# Patient Record
Sex: Male | Born: 1985
Health system: Southern US, Community
[De-identification: ages and names within clinical notes are randomized; demographics above are authoritative.]

## PROBLEM LIST (undated history)

## (undated) DIAGNOSIS — M62830 Muscle spasm of back: Secondary | ICD-10-CM

## (undated) DIAGNOSIS — M2550 Pain in unspecified joint: Secondary | ICD-10-CM

## (undated) DIAGNOSIS — Z973 Presence of spectacles and contact lenses: Secondary | ICD-10-CM

## (undated) HISTORY — PX: COLON SURGERY: SHX602

## (undated) HISTORY — DX: Presence of spectacles and contact lenses: Z97.3

## (undated) HISTORY — PX: FINGER SURGERY: SHX640

## (undated) HISTORY — DX: Pain in unspecified joint: M25.50

## (undated) HISTORY — DX: Muscle spasm of back: M62.830

---
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
## 2014-11-07 ENCOUNTER — Encounter: Payer: Self-pay | Admitting: Medical

## 2014-11-26 ENCOUNTER — Encounter: Payer: Self-pay | Admitting: Medical

## 2014-11-26 ENCOUNTER — Ambulatory Visit (INDEPENDENT_AMBULATORY_CARE_PROVIDER_SITE_OTHER): Payer: BLUE CROSS/BLUE SHIELD | Admitting: Medical

## 2014-11-26 VITALS — BP 100/70 | HR 62 | Temp 98.2°F | Resp 16 | Ht 76.0 in | Wt 224.0 lb

## 2014-11-26 DIAGNOSIS — Z0000 Encounter for general adult medical examination without abnormal findings: Secondary | ICD-10-CM | POA: Diagnosis not present

## 2014-11-26 DIAGNOSIS — G245 Blepharospasm: Secondary | ICD-10-CM

## 2014-11-26 DIAGNOSIS — M791 Myalgia, unspecified site: Secondary | ICD-10-CM

## 2014-11-26 DIAGNOSIS — Z113 Encounter for screening for infections with a predominantly sexual mode of transmission: Secondary | ICD-10-CM | POA: Diagnosis not present

## 2014-11-26 DIAGNOSIS — M2550 Pain in unspecified joint: Secondary | ICD-10-CM

## 2014-11-26 LAB — COMPREHENSIVE METABOLIC PANEL
ALT: 21 U/L (ref 0–53)
AST: 24 U/L (ref 0–37)
Albumin: 4.3 g/dL (ref 3.5–5.2)
Alkaline Phosphatase: 54 U/L (ref 39–117)
BUN: 12 mg/dL (ref 6–23)
CO2: 27 mEq/L (ref 19–32)
Calcium: 9.6 mg/dL (ref 8.4–10.5)
Chloride: 102 mEq/L (ref 96–112)
Creat: 1.04 mg/dL (ref 0.50–1.35)
Glucose, Bld: 83 mg/dL (ref 70–99)
Potassium: 4.4 mEq/L (ref 3.5–5.3)
Sodium: 140 mEq/L (ref 135–145)
Total Bilirubin: 0.5 mg/dL (ref 0.2–1.2)
Total Protein: 6.7 g/dL (ref 6.0–8.3)

## 2014-11-26 LAB — POCT URINALYSIS DIPSTICK
Bilirubin, UA: NEGATIVE
Blood, UA: NEGATIVE
Glucose, UA: NEGATIVE
Ketones, UA: NEGATIVE
Leukocytes, UA: NEGATIVE
Nitrite, UA: NEGATIVE
Protein, UA: NEGATIVE
Spec Grav, UA: 1.015
Urobilinogen, UA: NEGATIVE
pH, UA: 6

## 2014-11-26 LAB — CBC
HCT: 46.1 % (ref 39.0–52.0)
Hemoglobin: 15.6 g/dL (ref 13.0–17.0)
MCH: 29.8 pg (ref 26.0–34.0)
MCHC: 33.8 g/dL (ref 30.0–36.0)
MCV: 88 fL (ref 78.0–100.0)
MPV: 8.6 fL (ref 8.6–12.4)
Platelets: 307 10*3/uL (ref 150–400)
RBC: 5.24 MIL/uL (ref 4.22–5.81)
RDW: 13.6 % (ref 11.5–15.5)
WBC: 4.1 10*3/uL (ref 4.0–10.5)

## 2014-11-26 LAB — LIPID PANEL
Cholesterol: 133 mg/dL (ref 0–200)
HDL: 52 mg/dL (ref >=40)
LDL Cholesterol: 69 mg/dL (ref 0–99)
Total CHOL/HDL Ratio: 2.6 Ratio
Triglycerides: 62 mg/dL (ref <150)
VLDL: 12 mg/dL (ref 0–40)

## 2014-11-26 LAB — CK: Total CK: 563 U/L — ABNORMAL HIGH (ref 7–232)

## 2014-11-26 LAB — TSH: TSH: 1.193 u[IU]/mL (ref 0.350–4.500)
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
## 2014-11-27 LAB — RPR

## 2014-11-27 LAB — GC/CHLAMYDIA PROBE AMP
CT Probe RNA: NEGATIVE
GC Probe RNA: NEGATIVE

## 2014-11-27 LAB — SEDIMENTATION RATE: Sed Rate: 1 mm/hr (ref 0–15)

## 2014-11-27 LAB — HIV ANTIBODY (ROUTINE TESTING W REFLEX): HIV 1&2 Ab, 4th Generation: NONREACTIVE

## 2015-01-23 ENCOUNTER — Ambulatory Visit (INDEPENDENT_AMBULATORY_CARE_PROVIDER_SITE_OTHER): Payer: BLUE CROSS/BLUE SHIELD | Admitting: Family Medicine

## 2015-01-23 VITALS — BP 115/76 | HR 87 | Temp 99.1°F | Resp 20 | Ht 75.0 in | Wt 217.2 lb

## 2015-01-23 DIAGNOSIS — L089 Local infection of the skin and subcutaneous tissue, unspecified: Secondary | ICD-10-CM

## 2015-01-23 DIAGNOSIS — R21 Rash and other nonspecific skin eruption: Secondary | ICD-10-CM | POA: Diagnosis not present

## 2015-01-23 DIAGNOSIS — R590 Localized enlarged lymph nodes: Secondary | ICD-10-CM

## 2015-01-23 DIAGNOSIS — L0390 Cellulitis, unspecified: Secondary | ICD-10-CM | POA: Diagnosis not present

## 2015-01-23 DIAGNOSIS — L0291 Cutaneous abscess, unspecified: Secondary | ICD-10-CM

## 2015-01-23 LAB — POCT CBC
Granulocyte percent: 47.9 %G (ref 37–80)
HCT, POC: 49.8 % (ref 43.5–53.7)
Hemoglobin: 16.2 g/dL (ref 14.1–18.1)
Lymph, poc: 2.1 (ref 0.6–3.4)
MCH, POC: 29.4 pg (ref 27–31.2)
MCHC: 32.6 g/dL (ref 31.8–35.4)
MCV: 90.1 fL (ref 80–97)
MID (cbc): 0.6 (ref 0–0.9)
MPV: 6.1 fL (ref 0–99.8)
POC Granulocyte: 2.4 (ref 2–6.9)
POC LYMPH PERCENT: 41.1 %L (ref 10–50)
POC MID %: 11 %M (ref 0–12)
Platelet Count, POC: 305 10*3/uL (ref 142–424)
RBC: 5.52 M/uL (ref 4.69–6.13)
RDW, POC: 14 %
WBC: 5 10*3/uL (ref 4.6–10.2)

## 2015-01-23 LAB — POCT GLYCOSYLATED HEMOGLOBIN (HGB A1C): Hemoglobin A1C: 5

## 2015-01-23 MED ORDER — CEPHALEXIN 500 MG PO CAPS: 500.0000 mg | ORAL_CAPSULE | Freq: Four times a day (QID) | ORAL | Status: DC

## 2015-01-23 MED ORDER — CEFTRIAXONE SODIUM 1 G IJ SOLR
1.0000 g | Freq: Once | INTRAMUSCULAR | Status: AC
  Administered 2015-01-23: 1 g via INTRAMUSCULAR

## 2015-01-23 NOTE — Progress Notes (Signed)
Chief Complaint:  Chief Complaint  Patient presents with  . Rash    Left Shoulder/Back/Neck  . Arm Pain    Left Arm-Numbness  . Knot on Neck    HPI: Mark Freeman is a 29 y.o. male who reports to [facility] today complaining of  10 day history of rash on his right arm and then started having rash on his left shoulder, upper back and neck. He started having swelling of his neck glands bailterally yesterday, it hurts. No fevers or chills. He denies any immunocompromise. No one in the house has it. It itches. There was some burning and pain, He did have some numbness and tingling on his left arm when it first started but no longer. He has been scratching it, there is no drainage. No hx of MRSA. Works out at home so is not in contact with anyone or thing with skin issues.   Past Medical History  Diagnosis Date  . Wears contact lenses   . Joint pain     right ankle, bilat knees, right hip  . Muscle spasm of back     occasionally   Past Surgical History  Procedure Laterality Date  . Finger surgery      right 4th s/p dislocation  . 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 Marital Status: Single    Spouse Name: N/A  . Number of Children: N/A  . Years of Education: N/A   Social History Main Topics  . Smoking status: Current Some Day Smoker    Types: Cigars  . Smokeless tobacco: Never Used     Comment: CIGARS  . Alcohol Use: 3.6 oz/week    2 Glasses of wine, 2 Cans of beer, 2 Shots of liquor, 0 Standard drinks or equivalent per week  . Drug Use: No  . Sexual Activity: Not on file   Other Topics Concern  . 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   Single, no children.   Exercise - weights, biking, running.     Family History  Problem Relation Age of Onset  . Cancer Father 42    prostate  . Cancer Maternal  Grandfather   . Urolithiasis Maternal Grandfather   . Stroke Paternal Grandmother   . Cancer Paternal Grandmother     breast  . Heart disease Neg Hx   . Diabetes Neg Hx   . Hypertension Neg Hx    No Known Allergies Prior to Admission medications   Medication Sig Start Date End Date Taking? Authorizing Provider  diphenhydrAMINE (BENADRYL) 25 MG tablet Take 2,550 mg by mouth at bedtime as needed.   Yes Historical Provider, MD     ROS: The patient denies fevers, chills, night sweats, unintentional weight loss, chest pain, palpitations, wheezing, dyspnea on exertion, nausea, vomiting, abdominal pain, dysuria, hematuria, melena  All other systems have been reviewed and were otherwise negative with the exception of those mentioned in the HPI and as above.    PHYSICAL EXAM: Filed Vitals:   01/23/15 2037  BP: 115/76  Pulse: 87  Temp: 99.1 F (37.3 C)  Resp: 20   Body mass index is 27.15 kg/(m^2).   General: Alert, no acute distress HEENT:  Normocephalic, atraumatic, oropharynx patent. EOMI, PERRLA, tm normal, fundo exam nl Cardiovascular:  Regular rate and  rhythm, no rubs murmurs or gallops.  No Carotid bruits, radial pulse intact.  Respiratory: Clear to auscultation bilaterally.  No wheezes, rales, or rhonchi.  No cyanosis, no use of accessory musculature Abdominal: No organomegaly, abdomen is soft and non-tender, positive bowel sounds. No masses. Skin: + macular pap diffuse erythematous excoriated rash on  Left shoulder , left upper back and folliculitis and abscess like lesions on nape of neck and in hairline. Tender area of neck with minimal fluctuant , slightly hard.  Neurologic: Facial musculature symmetric. Psychiatric: Patient acts appropriately throughout our interaction. Lymphatic: No axillary or submandibular lymphadenopathy. HE has bialteral Cervical LAD, left more prominent than right Musculoskeletal: Gait intact. No edema, tenderness   LABS: Results for orders  placed or performed in visit on 01/23/15  POCT CBC  Result Value Ref Range   WBC 5.0 4.6 - 10.2 K/uL   Lymph, poc 2.1 0.6 - 3.4   POC LYMPH PERCENT 41.1 10 - 50 %L   MID (cbc) 0.6 0 - 0.9   POC MID % 11.0 0 - 12 %M   POC Granulocyte 2.4 2 - 6.9   Granulocyte percent 47.9 37 - 80 %G   RBC 5.52 4.69 - 6.13 M/uL   Hemoglobin 16.2 14.1 - 18.1 g/dL   HCT, POC 96.0 45.4 - 53.7 %   MCV 90.1 80 - 97 fL   MCH, POC 29.4 27 - 31.2 pg   MCHC 32.6 31.8 - 35.4 g/dL   RDW, POC 09.8 %   Platelet Count, POC 305 142 - 424 K/uL   MPV 6.1 0 - 99.8 fL  POCT glycosylated hemoglobin (Hb A1C)  Result Value Ref Range   Hemoglobin A1C 5.0      EKG/XRAY:   Primary read interpreted by Dr. Conley Rolls at [facility].   ASSESSMENT/PLAN: Encounter Diagnoses  Name Primary?  . Lymphadenopathy, cervical   . Rash and nonspecific skin eruption   . Skin infection Yes  . Cellulitis and abscess    Rocephin 1 gram given Labs for HIV pending Rx Keflex, warm compresses If no improvement and pain worsen and rash does not go more across midline in neck area then consider Shingles Since he has abscess , folliculitis and there is some crossing of the midline I am gogint o treat it as flliculitis/cellulitis/abscess Fu prn   Gross sideeffects, risk and benefits, and alternatives of medications d/w patient. Patient is aware that all medications have potential sideeffects and we are unable to predict every sideeffect or drug-drug interaction that may occur.  Jaeven Wanzer DO  01/24/2015 8:14 AM

## 2015-01-23 NOTE — Patient Instructions (Addendum)
Abscess An abscess is an infected area that contains a collection of pus and debris.It can occur in almost any part of the body. An abscess is also known as a furuncle or boil. CAUSES  An abscess occurs when tissue gets infected. This can occur from blockage of oil or sweat glands, infection of hair follicles, or a minor injury to the skin. As the body tries to fight the infection, pus collects in the area and creates pressure under the skin. This pressure causes pain. People with weakened immune systems have difficulty fighting infections and get certain abscesses more often.  SYMPTOMS Usually an abscess develops on the skin and becomes a painful mass that is red, warm, and tender. If the abscess forms under the skin, you may feel a moveable soft area under the skin. Some abscesses break open (rupture) on their own, but most will continue to get worse without care. The infection can spread deeper into the body and eventually into the bloodstream, causing you to feel ill.  DIAGNOSIS  Your caregiver will take your medical history and perform a physical exam. A sample of fluid may also be taken from the abscess to determine what is causing your infection. TREATMENT  Your caregiver may prescribe antibiotic medicines to fight the infection. However, taking antibiotics alone usually does not cure an abscess. Your caregiver may need to make a small cut (incision) in the abscess to drain the pus. In some cases, gauze is packed into the abscess to reduce pain and to continue draining the area. HOME CARE INSTRUCTIONS   Only take over-the-counter or prescription medicines for pain, discomfort, or fever as directed by your caregiver.  If you were prescribed antibiotics, take them as directed. Finish them even if you start to feel better.  If gauze is used, follow your caregiver's directions for changing the gauze.  To avoid spreading the infection:  Keep your draining abscess covered with a  bandage.  Wash your hands well.  Do not share personal care items, towels, or whirlpools with others.  Avoid skin contact with others.  Keep your skin and clothes clean around the abscess.  Keep all follow-up appointments as directed by your caregiver. SEEK MEDICAL CARE IF:   You have increased pain, swelling, redness, fluid drainage, or bleeding.  You have muscle aches, chills, or a general ill feeling.  You have a fever. MAKE SURE YOU:   Understand these instructions.  Will watch your condition.  Will get help right away if you are not doing well or get worse. Document Released: 03/18/2005 Document Revised: 12/08/2011 Document Reviewed: 08/21/2011 [facility] Patient Information 2015 [place], [place]. This information is not intended to replace advice given to you by your health care provider. Make sure you discuss any questions you have with your health care provider.   Cephalexin tablets or capsules What is this medicine? CEPHALEXIN (sef a LEX in) is a cephalosporin antibiotic. It is used to treat certain kinds of bacterial infections It will not work for colds, flu, or other viral infections. This medicine may be used for other purposes; ask your health care provider or pharmacist if you have questions. COMMON BRAND NAME(S): Biocef, Keflex, Keftab What should I tell my health care provider before I take this medicine? They need to know if you have any of these conditions: -kidney disease -stomach or intestine problems, especially colitis -an unusual or allergic reaction to cephalexin, other cephalosporins, penicillins, other antibiotics, medicines, foods, dyes or preservatives -pregnant or trying to get pregnant -  breast-feeding How should I use this medicine? Take this medicine by mouth with a full glass of water. Follow the directions on the prescription label. This medicine can be taken with or without food. Take your medicine at regular intervals. Do not take your  medicine more often than directed. Take all of your medicine as directed even if you think you are better. Do not skip doses or stop your medicine early. Talk to your pediatrician regarding the use of this medicine in children. While this drug may be prescribed for selected conditions, precautions do apply. Overdosage: If you think you have taken too much of this medicine contact a poison control center or emergency room at once. NOTE: This medicine is only for you. Do not share this medicine with others. What if I miss a dose? If you miss a dose, take it as soon as you can. If it is almost time for your next dose, take only that dose. Do not take double or extra doses. There should be at least 4 to 6 hours between doses. What may interact with this medicine? -probenecid -some other antibiotics This list may not describe all possible interactions. Give your health care provider a list of all the medicines, herbs, non-prescription drugs, or dietary supplements you use. Also tell them if you smoke, drink alcohol, or use illegal drugs. Some items may interact with your medicine. What should I watch for while using this medicine? Tell your doctor or health care professional if your symptoms do not begin to improve in a few days. Do not treat diarrhea with over the counter products. Contact your doctor if you have diarrhea that lasts more than 2 days or if it is severe and watery. If you have diabetes, you may get a false-positive result for sugar in your urine. Check with your doctor or health care professional. What side effects may I notice from receiving this medicine? Side effects that you should report to your doctor or health care professional as soon as possible: -allergic reactions like skin rash, itching or hives, swelling of the face, lips, or tongue -breathing problems -pain or trouble passing urine -redness, blistering, peeling or loosening of the skin, including inside the mouth -severe  or watery diarrhea -unusually weak or tired -yellowing of the eyes, skin Side effects that usually do not require medical attention (report to your doctor or health care professional if they continue or are bothersome): -gas or heartburn -genital or anal irritation -headache -joint or muscle pain -nausea, vomiting This list may not describe all possible side effects. Call your doctor for medical advice about side effects. You may report side effects to FDA at 1-800-FDA-1088. Where should I keep my medicine? Keep out of the reach of children. Store at room temperature between 59 and 86 degrees F (15 and 30 degrees C). Throw away any unused medicine after the expiration date. NOTE: This sheet is a summary. It may not cover all possible information. If you have questions about this medicine, talk to your doctor, pharmacist, or health care provider.  2015, Elsevier/Gold Standard. (2007-09-12 17:09:13)

## 2015-01-24 ENCOUNTER — Telehealth: Payer: Self-pay | Admitting: Family Medicine

## 2015-01-24 LAB — HIV ANTIBODY (ROUTINE TESTING W REFLEX): HIV 1&2 Ab, 4th Generation: NONREACTIVE

## 2015-01-24 NOTE — Telephone Encounter (Signed)
Spoke to patient about labs, rash drying up, not worse. NOt greatly iproved,just got keflex

## 2015-01-25 ENCOUNTER — Encounter: Payer: Self-pay | Admitting: Family Medicine

## 2015-01-25 ENCOUNTER — Ambulatory Visit (INDEPENDENT_AMBULATORY_CARE_PROVIDER_SITE_OTHER): Payer: BLUE CROSS/BLUE SHIELD | Admitting: Family Medicine

## 2015-01-25 ENCOUNTER — Telehealth: Payer: Self-pay

## 2015-01-25 VITALS — BP 116/80 | HR 96 | Wt 221.0 lb

## 2015-01-25 DIAGNOSIS — B029 Zoster without complications: Secondary | ICD-10-CM | POA: Diagnosis not present

## 2015-01-25 MED ORDER — VALACYCLOVIR HCL 1 G PO TABS: 1000.0000 mg | ORAL_TABLET | Freq: Three times a day (TID) | ORAL | Status: DC

## 2015-01-25 NOTE — Progress Notes (Signed)
   Subjective:    Patient ID: Mark Freeman, male    DOB: Apr 26, 1986, 29 y.o.   MRN: [id number]  HPI He is here for evaluation of rash. He states that it started on Monday at the base of his neck and on the left shoulder. He was seen in urgent care and placed on an anti-biotic. He is here for follow-up. He states that he is noted a spread of this and does have burning and tingling sensation down the left arm.   Review of Systems     Objective:   Physical Exam Erythematous raised early vesicles are noted in the C5 distribution on the neck and shoulder.       Assessment & Plan:  Shingles - Plan: valACYclovir (VALTREX) 1000 MG tablet  he has classic C5 nerve root distribution. I will place him on Valtrex. Explained the diagnosis and to avoid being around pregnant women and small children. I will give him Valtrex but I explained that might not help. He is to use ibuprofen 800 3 times a day for the pain. If he needs a stronger pain medication he will call me.

## 2015-01-25 NOTE — Patient Instructions (Signed)

## 2015-01-25 NOTE — Telephone Encounter (Signed)
Julie-I don't know what your normal policy is but when someone does this I just write it off as a no charge, shingles was in the differential. I called the patient 12 hours after his OV  and he said the rash was drying up. I was planning to give it another 12 hours to see how the antibiotics would help once it was in his system for 48 hours and change to valtrex if no improvement. I will call mom to say that we will ask billing to do a no charge. Mom was notified of no charge notice. Thanks

## 2015-01-25 NOTE — Telephone Encounter (Signed)
  The following email was submitted to your website from [place], [facility] My son came into your office on Wednesday, August 3 for a very bad rash and swollen lymph nodes on the side of his neck.  The doctor diagnosed him with a skin rash and an abscess on his hairline.  He received a antibodic shot and prescription for antibodics.  As of this morning, the lymph nodes pain had increased.  He was able to go to his regular doctor (John C. Lalonde)and was diagnosed with shingles. He should [place] a shingles pattern and it was the exact pattern my son has along with the pain that went down his arm.  He was given prescription for Valtrex, was told to pain advil and put cold compressor when it starts itching&gt; Dr. Susann Givens told him to stop taking the antibodics that was prescribed by your office.  We are very grateful the Urgent was able to see him and the doctor/staff there was very nice and concerned, but unfortunately, misdiagnosed him.  I'm his mother, I was with him on both appointments.  I'm writing to request that he should not be billed for this visit given the outcome.  I've provided my email address and my cell is [phone].  Thank you so much.  Date of birth: [birth year]

## 2015-01-28 NOTE — Telephone Encounter (Signed)
Forwarded Dr. Irwin Brakeman instructions to billing staff.

## 2015-11-25 ENCOUNTER — Encounter (HOSPITAL_COMMUNITY): Payer: Self-pay

## 2015-11-25 ENCOUNTER — Emergency Department (HOSPITAL_COMMUNITY)
Admission: EM | Admit: 2015-11-25 | Discharge: 2015-11-25 | Disposition: A | Discharge status: Home / Self Care | Payer: BLUE CROSS/BLUE SHIELD | Attending: Emergency Medicine | Admitting: Emergency Medicine

## 2015-11-25 ENCOUNTER — Emergency Department (HOSPITAL_COMMUNITY): Payer: BLUE CROSS/BLUE SHIELD

## 2015-11-25 DIAGNOSIS — Y92410 Unspecified street and highway as the place of occurrence of the external cause: Secondary | ICD-10-CM | POA: Diagnosis not present

## 2015-11-25 DIAGNOSIS — F17210 Nicotine dependence, cigarettes, uncomplicated: Secondary | ICD-10-CM | POA: Diagnosis not present

## 2015-11-25 DIAGNOSIS — S53402A Unspecified sprain of left elbow, initial encounter: Secondary | ICD-10-CM | POA: Diagnosis not present

## 2015-11-25 DIAGNOSIS — Z79899 Other long term (current) drug therapy: Secondary | ICD-10-CM | POA: Diagnosis not present

## 2015-11-25 DIAGNOSIS — Y998 Other external cause status: Secondary | ICD-10-CM | POA: Diagnosis not present

## 2015-11-25 DIAGNOSIS — Y9389 Activity, other specified: Secondary | ICD-10-CM | POA: Insufficient documentation

## 2015-11-25 DIAGNOSIS — Z792 Long term (current) use of antibiotics: Secondary | ICD-10-CM | POA: Diagnosis not present

## 2015-11-25 DIAGNOSIS — S0990XA Unspecified injury of head, initial encounter: Secondary | ICD-10-CM | POA: Diagnosis not present

## 2015-11-25 DIAGNOSIS — S59902A Unspecified injury of left elbow, initial encounter: Secondary | ICD-10-CM | POA: Diagnosis present

## 2015-11-25 MED ORDER — ACETAMINOPHEN 325 MG PO TABS
650.0000 mg | ORAL_TABLET | Freq: Once | ORAL | Status: AC
  Administered 2015-11-25: 650 mg via ORAL
  Filled 2015-11-25: qty 2

## 2015-11-25 MED ORDER — NAPROXEN 250 MG PO TABS
500.0000 mg | ORAL_TABLET | Freq: Once | ORAL | Status: AC
  Administered 2015-11-25: 500 mg via ORAL
  Filled 2015-11-25: qty 2

## 2015-11-25 MED ORDER — TETANUS-DIPHTH-ACELL PERTUSSIS 5-2.5-18.5 LF-MCG/0.5 IM SUSP
0.5000 mL | Freq: Once | INTRAMUSCULAR | Status: AC
  Administered 2015-11-25: 0.5 mL via INTRAMUSCULAR
  Filled 2015-11-25: qty 0.5
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
## 2015-11-25 NOTE — Discharge Instructions (Signed)
Elastic Bandage and RICE °WHAT DOES AN ELASTIC BANDAGE DO? °Elastic bandages come in different shapes and sizes. They generally provide support to your injury and reduce swelling while you are healing, but they can perform different functions. Your health care provider will help you to decide what is best for your protection, recovery, or rehabilitation following an injury. °WHAT ARE SOME GENERAL TIPS FOR USING AN ELASTIC BANDAGE? °· Use the bandage as directed by the maker of the bandage that you are using. °· Do not wrap the bandage too tightly. This may cut off the circulation in the arm or leg in the area below the bandage. °¨ If part of your body beyond the bandage becomes blue, numb, cold, swollen, or is more painful, your bandage is most likely too tight. If this occurs, remove your bandage and reapply it more loosely. °· See your health care provider if the bandage seems to be making your problems worse rather than better. °· An elastic bandage should be removed and reapplied every 3-4 hours or as directed by your health care provider. °WHAT IS RICE? °The routine care of many injuries includes rest, ice, compression, and elevation (RICE therapy).  °Rest °Rest is required to allow your body to heal. Generally, you can resume your routine activities when you are comfortable and have been given permission by your health care provider. °Ice °Icing your injury helps to keep the swelling down and it reduces pain. Do not apply ice directly to your skin. °· Put ice in a plastic bag. °· Place a towel between your skin and the bag. °· Leave the ice on for 20 minutes, 2-3 times per day. °Do this for as long as you are directed by your health care provider. °Compression °Compression helps to keep swelling down, gives support, and helps with discomfort. Compression may be done with an elastic bandage. °Elevation °Elevation helps to reduce swelling and it decreases pain. If possible, your injured area should be placed at  or above the level of your heart or the center of your chest. °WHEN SHOULD I SEEK MEDICAL CARE? °You should seek medical care if: °· You have persistent pain and swelling. °· Your symptoms are getting worse rather than improving. °These symptoms may indicate that further evaluation or further X-rays are needed. Sometimes, X-rays may not show a small broken bone (fracture) until a number of days later. Make a follow-up appointment with your health care provider. Ask when your X-ray results will be ready. Make sure that you get your X-ray results. °WHEN SHOULD I SEEK IMMEDIATE MEDICAL CARE? °You should seek immediate medical care if: °· You have a sudden onset of severe pain at or below the area of your injury. °· You develop redness or increased swelling around your injury. °· You have tingling or numbness at or below the area of your injury that does not improve after you remove the elastic bandage. °  °This information is not intended to replace advice given to you by your health care provider. Make sure you discuss any questions you have with your health care provider. °  °Document Released: 11/28/2001 Document Revised: 02/27/2015 Document Reviewed: 01/22/2014 °Elsevier Interactive Patient Education ©2016 [employer] ° °

## 2015-11-27 ENCOUNTER — Encounter: Payer: Self-pay | Admitting: Medical

## 2015-11-27 ENCOUNTER — Ambulatory Visit (INDEPENDENT_AMBULATORY_CARE_PROVIDER_SITE_OTHER): Payer: BLUE CROSS/BLUE SHIELD | Admitting: Medical

## 2015-11-27 VITALS — BP 116/82 | HR 82 | Wt 227.0 lb

## 2015-11-27 DIAGNOSIS — M6289 Other specified disorders of muscle: Secondary | ICD-10-CM

## 2015-11-27 DIAGNOSIS — M549 Dorsalgia, unspecified: Secondary | ICD-10-CM | POA: Diagnosis not present

## 2015-11-27 DIAGNOSIS — M25522 Pain in left elbow: Secondary | ICD-10-CM | POA: Diagnosis not present

## 2015-11-27 DIAGNOSIS — G729 Myopathy, unspecified: Secondary | ICD-10-CM | POA: Diagnosis not present

## 2015-11-27 DIAGNOSIS — S20212A Contusion of left front wall of thorax, initial encounter: Secondary | ICD-10-CM | POA: Diagnosis not present

## 2015-11-27 DIAGNOSIS — M25511 Pain in right shoulder: Secondary | ICD-10-CM

## 2015-11-27 MED ORDER — METHOCARBAMOL 500 MG PO TABS: 500.0000 mg | ORAL_TABLET | Freq: Three times a day (TID) | ORAL | Status: DC | PRN

## 2015-11-27 MED ORDER — NAPROXEN 500 MG PO TABS: 500.0000 mg | ORAL_TABLET | Freq: Two times a day (BID) | ORAL | Status: DC
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
## 2016-03-25 ENCOUNTER — Ambulatory Visit (INDEPENDENT_AMBULATORY_CARE_PROVIDER_SITE_OTHER): Payer: BLUE CROSS/BLUE SHIELD | Admitting: Medical

## 2016-03-25 ENCOUNTER — Encounter: Payer: Self-pay | Admitting: Medical

## 2016-03-25 VITALS — BP 110/80 | HR 96 | Ht 75.0 in | Wt 228.0 lb

## 2016-03-25 DIAGNOSIS — Z113 Encounter for screening for infections with a predominantly sexual mode of transmission: Secondary | ICD-10-CM

## 2016-03-25 DIAGNOSIS — Z111 Encounter for screening for respiratory tuberculosis: Secondary | ICD-10-CM

## 2016-03-25 DIAGNOSIS — Z7185 Encounter for immunization safety counseling: Secondary | ICD-10-CM

## 2016-03-25 DIAGNOSIS — Z7189 Other specified counseling: Secondary | ICD-10-CM | POA: Diagnosis not present

## 2016-03-25 DIAGNOSIS — Z139 Encounter for screening, unspecified: Secondary | ICD-10-CM

## 2016-03-25 DIAGNOSIS — Z021 Encounter for pre-employment examination: Secondary | ICD-10-CM

## 2016-03-25 NOTE — Progress Notes (Signed)
Subjective: Chief Complaint  Patient presents with  . Acute Visit    would like STD testing    Here for labs and concerns.     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   No prior PPD.     He wants STD testing.  No concern for current symptoms, no penile discharge, no urinary symptoms.     Past Medical History:  Diagnosis Date  . Joint pain    right ankle, bilat knees, right hip  . Muscle spasm of back    occasionally  . Wears contact lenses    No current outpatient prescriptions on file prior to visit.   No current facility-administered medications on file prior to visit.    ROS as in subjective   Objective: BP 110/80   Pulse 96   Ht 6\' 3"  (1.905 m)   Wt 228 lb (103.4 kg)   SpO2 98%   BMI 28.50 kg/m    Gen: wd, wn, nad    Assessment: Encounter Diagnoses  Name Primary?  . Pre-employment drug screening Yes  . Screen for STD (sexually transmitted disease)   . Screening for condition   . Vaccine counseling   . Screening for tuberculosis     Plan: Reviewed prior vaccine records.   discussed safe sex, labs today.  He will return next week for PPD placement.   [facility] was seen today for acute visit.  Diagnoses and all orders for this visit:  Pre-employment drug screening -     Hepatitis B surface antibody -     Varicella zoster antibody, IgG  Screen for STD (sexually transmitted disease) -     RPR -     HIV antibody -     GC/Chlamydia Probe Amp  Screening for condition -     Hepatitis B surface antibody -     Varicella zoster antibody, IgG  Vaccine counseling  Screening for tuberculosis

## 2016-03-26 LAB — HIV ANTIBODY (ROUTINE TESTING W REFLEX): HIV 1&2 Ab, 4th Generation: NONREACTIVE

## 2016-03-26 LAB — GC/CHLAMYDIA PROBE AMP
CT Probe RNA: NOT DETECTED
GC Probe RNA: NOT DETECTED

## 2016-03-26 LAB — VARICELLA ZOSTER ANTIBODY, IGG: Varicella IgG: >4000 Index — ABNORMAL HIGH (ref <135.00)

## 2016-03-26 LAB — HEPATITIS B SURFACE ANTIBODY, QUANTITATIVE: Hepatitis B-Post: 315 m[IU]/mL

## 2016-03-26 LAB — RPR

## 2016-03-31 ENCOUNTER — Other Ambulatory Visit (INDEPENDENT_AMBULATORY_CARE_PROVIDER_SITE_OTHER): Payer: BLUE CROSS/BLUE SHIELD

## 2016-03-31 DIAGNOSIS — Z111 Encounter for screening for respiratory tuberculosis: Secondary | ICD-10-CM | POA: Diagnosis not present

## 2016-04-02 LAB — TB SKIN TEST
Induration: 0 mm
TB Skin Test: NEGATIVE

## 2016-04-14 ENCOUNTER — Other Ambulatory Visit (INDEPENDENT_AMBULATORY_CARE_PROVIDER_SITE_OTHER): Payer: BLUE CROSS/BLUE SHIELD

## 2016-04-14 DIAGNOSIS — Z111 Encounter for screening for respiratory tuberculosis: Secondary | ICD-10-CM

## 2016-04-14 DIAGNOSIS — Z021 Encounter for pre-employment examination: Secondary | ICD-10-CM

## 2016-04-16 LAB — TB SKIN TEST
Induration: 0 mm
TB Skin Test: NEGATIVE
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
## 2018-02-24 ENCOUNTER — Emergency Department (HOSPITAL_COMMUNITY)
Admission: EM | Admit: 2018-02-24 | Discharge: 2018-02-24 | Disposition: A | Discharge status: Home / Self Care | Payer: BLUE CROSS/BLUE SHIELD | Attending: Emergency Medicine | Admitting: Emergency Medicine

## 2018-02-24 ENCOUNTER — Encounter (HOSPITAL_COMMUNITY): Payer: Self-pay | Admitting: Emergency Medicine

## 2018-02-24 ENCOUNTER — Emergency Department (HOSPITAL_COMMUNITY): Payer: BLUE CROSS/BLUE SHIELD

## 2018-02-24 ENCOUNTER — Other Ambulatory Visit: Payer: Self-pay

## 2018-02-24 DIAGNOSIS — F17290 Nicotine dependence, other tobacco product, uncomplicated: Secondary | ICD-10-CM | POA: Diagnosis not present

## 2018-02-24 DIAGNOSIS — Z79899 Other long term (current) drug therapy: Secondary | ICD-10-CM | POA: Insufficient documentation

## 2018-02-24 DIAGNOSIS — K2900 Acute gastritis without bleeding: Secondary | ICD-10-CM | POA: Diagnosis not present

## 2018-02-24 DIAGNOSIS — R079 Chest pain, unspecified: Secondary | ICD-10-CM | POA: Diagnosis present

## 2018-02-24 LAB — BASIC METABOLIC PANEL
Anion gap: 10 (ref 5–15)
BUN: 10 mg/dL (ref 6–20)
CALCIUM: 10.1 mg/dL (ref 8.9–10.3)
CHLORIDE: 102 mmol/L (ref 98–111)
CO2: 26 mmol/L (ref 22–32)
CREATININE: 1.09 mg/dL (ref 0.61–1.24)
GFR calc Af Amer: >60 mL/min (ref >60)
GFR calc non Af Amer: >60 mL/min (ref >60)
GLUCOSE: 80 mg/dL (ref 70–99)
Potassium: 4.3 mmol/L (ref 3.5–5.1)
Sodium: 138 mmol/L (ref 135–145)

## 2018-02-24 LAB — CBC
HCT: 51.7 % (ref 39.0–52.0)
Hemoglobin: 17.1 g/dL — ABNORMAL HIGH (ref 13.0–17.0)
MCH: 29.6 pg (ref 26.0–34.0)
MCHC: 33.1 g/dL (ref 30.0–36.0)
MCV: 89.6 fL (ref 78.0–100.0)
PLATELETS: 307 10*3/uL (ref 150–400)
RBC: 5.77 MIL/uL (ref 4.22–5.81)
RDW: 11.7 % (ref 11.5–15.5)
WBC: 4.9 10*3/uL (ref 4.0–10.5)

## 2018-02-24 LAB — I-STAT TROPONIN, ED: TROPONIN I, POC: 0.02 ng/mL (ref 0.00–0.08)

## 2018-02-24 LAB — HEPATIC FUNCTION PANEL
ALK PHOS: 49 U/L (ref 38–126)
ALT: 19 U/L (ref 0–44)
AST: 21 U/L (ref 15–41)
Albumin: 4.4 g/dL (ref 3.5–5.0)
BILIRUBIN DIRECT: 0.2 mg/dL (ref 0.0–0.2)
BILIRUBIN INDIRECT: 1 mg/dL — AB (ref 0.3–0.9)
BILIRUBIN TOTAL: 1.2 mg/dL (ref 0.3–1.2)
Total Protein: 7.3 g/dL (ref 6.5–8.1)

## 2018-02-24 LAB — LIPASE, BLOOD: LIPASE: 30 U/L (ref 11–51)

## 2018-02-24 MED ORDER — OMEPRAZOLE 20 MG PO CPDR: 20.0000 mg | DELAYED_RELEASE_CAPSULE | Freq: Every day | ORAL | 0 refills | Status: AC

## 2018-02-24 MED ORDER — RANITIDINE HCL 150 MG PO CAPS: 150.0000 mg | ORAL_CAPSULE | Freq: Every day | ORAL | 0 refills | Status: AC

## 2018-02-24 MED ORDER — GI COCKTAIL ~~LOC~~
30.0000 mL | Freq: Once | ORAL | Status: AC
  Administered 2018-02-24: 30 mL via ORAL
  Filled 2018-02-24: qty 30

## 2018-02-24 NOTE — Discharge Instructions (Addendum)
Please read attached information. If you experience any new or worsening signs or symptoms please return to the emergency room for evaluation. Please follow-up with your primary care provider or specialist as discussed. Please use medication prescribed only as directed and discontinue taking if you have any concerning signs or symptoms.   °

## 2018-02-24 NOTE — ED Provider Notes (Signed)
[facility] EMERGENCY DEPARTMENT Provider Note   CSN: [id number] Arrival date & time: 02/24/18  1718   History   Chief Complaint Chief Complaint  Patient presents with  . Chest Pain    HPI [facility] is a 32 y.o. male.  HPI    32 year old male presents today with complaints of chest pain. Patient notes symptoms started approximately 4 AM this morning. He notes a sharp coming and going sensation in his mid sternal region radiating down into his abdomen. Notes this is intermittent, severe in nature. He denies any associated shortness of breath, nonexertional, no pleuritic component to this. He has had decreased appetite and has not eaten today. Patient denies any lower abdominal pain fever chills nausea or vomiting. He denies any history of significant acid reflux but knows time to time he does have indigestion. Patient reports that he smokes cigars and drinks occasionally, denies any personal family cardiac history. He denies any history DVT or PE or any significant risk factors. Symptoms very mild at the time my evaluation.   Past Medical History:  Diagnosis Date  . Joint pain    right ankle, bilat knees, right hip  . Muscle spasm of back    occasionally  . Wears contact lenses     Patient Active Problem List   Diagnosis Date Noted  . Pre-employment drug screening 03/25/2016  . Screen for STD (sexually transmitted disease) 03/25/2016  . Vaccine counseling 03/25/2016    Past Surgical History:  Procedure Laterality Date  . 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 FINGER SURGERY     right 4th s/p dislocation        Home Medications    Prior to Admission medications   Medication Sig Start Date End Date Taking? Authorizing Provider  diphenhydrAMINE (BENADRYL) 25 MG tablet Take 25 mg by mouth at bedtime.    [provider]  omeprazole (PRILOSEC) 20 MG capsule Take 1 capsule (20 mg total) by mouth daily. 02/24/18   Daphanie Oquendo,  Tinnie Gens, PA-C  ranitidine (ZANTAC) 150 MG capsule Take 1 capsule (150 mg total) by mouth daily. 02/24/18   Eyvonne Mechanic, PA-C    Family History Family History  Problem Relation Age of Onset  . Cancer Father 29       prostate  . Cancer Maternal Grandfather   . Urolithiasis Maternal Grandfather   . Stroke Paternal Grandmother   . Cancer Paternal Grandmother        breast  . Heart disease Neg Hx   . Diabetes Neg Hx   . Hypertension Neg Hx     Social History Social History   Tobacco Use  . Smoking status: Current Some Day Smoker    Types: Cigars  . Smokeless tobacco: Never Used  . Tobacco comment: CIGARS  Substance Use Topics  . Alcohol use: Yes    Alcohol/week: 6.0 standard drinks    Types: 2 Glasses of wine, 2 Cans of beer, 2 Shots of liquor per week  . Drug use: No     Allergies   Patient has no known allergies.   Review of Systems Review of Systems  All other systems reviewed and are negative.    Physical Exam Updated Vital Signs BP (!) 138/95 (BP Location: Left Arm)   Pulse 65   Temp 98.6 F (37 C) (Oral)   Resp 15   Ht 6\' 4"  (1.93 m)   Wt 113.4 kg   SpO2 100%  BMI 30.43 kg/m   Physical Exam  Constitutional: He is oriented to person, place, and time. He appears well-developed and well-nourished.  HENT:  Head: Normocephalic and atraumatic.  Eyes: Pupils are equal, round, and reactive to light. Conjunctivae are normal. Right eye exhibits no discharge. Left eye exhibits no discharge. No scleral icterus.  Neck: Normal range of motion. No JVD present. No tracheal deviation present.  Cardiovascular: Normal rate, regular rhythm, normal heart sounds and intact distal pulses. Exam reveals no gallop and no friction rub.  No murmur heard. Pulmonary/Chest: Effort normal and breath sounds normal. No stridor. No respiratory distress. He has no wheezes. He has no rales. He exhibits no tenderness.  Abdominal: Soft. He exhibits no distension and no mass. There is  no tenderness. There is no rebound and no guarding. No hernia.  Neurological: He is alert and oriented to person, place, and time. Coordination normal.  Psychiatric: He has a normal mood and affect. His behavior is normal. Judgment and thought content normal.  Nursing note and vitals reviewed.    ED Treatments / Results  Labs (all labs ordered are listed, but only abnormal results are displayed) Labs Reviewed  CBC - Abnormal; Notable for the following components:      Result Value   Hemoglobin 17.1 (*)    All other components within normal limits  HEPATIC FUNCTION PANEL - Abnormal; Notable for the following components:   Indirect Bilirubin 1.0 (*)    All other components within normal limits  BASIC METABOLIC PANEL  LIPASE, BLOOD  I-STAT TROPONIN, ED    EKG None  Radiology Dg Chest 2 View  Result Date: 02/24/2018 CLINICAL DATA:  Pt c/o central chest pain that radiates to his abdomen x 1 day. No hx of heart or lung problems. Pt is a current smoker. EXAM: CHEST - 2 VIEW COMPARISON:  None. FINDINGS: The heart size and mediastinal contours are within normal limits. Both lungs are clear. The visualized skeletal structures are unremarkable. IMPRESSION: No active cardiopulmonary disease. Electronically Signed   By: Norva Pavlov M.D.   On: 02/24/2018 18:26    Procedures Procedures (including critical care time)  Medications Ordered in ED Medications  gi cocktail (Maalox,Lidocaine,Donnatal) (30 mLs Oral Given 02/24/18 2046)     Initial Impression / Assessment and Plan / ED Course  I have reviewed the triage vital signs and the nursing notes.  Pertinent labs & imaging results that were available during my care of the patient were reviewed by me and considered in my medical decision making (see chart for details).     Labs: I-STAT troponin, CBC, BMP, hepatic function, lipase  Imaging:  Consults:  Therapeutics:GI cocktail  Discharge Meds: Zantac,  omeprazole  Assessment/Plan: 32 year old male presents today with likely indigestion. He has no other symptoms consistent with ACS, dissection, PE, or any other life-threatening etiology. He is a soft nontender abdomen. He had improvement in symptoms with GI cocktail here. Patient discharged with instruction to use Zantac if symptoms are not improving the Zantac is continue using this and initiated omeprazole. Patient will follow up as an outpatient if symptoms persist. Strict return precautions given. Patient verbalized understanding and agreement to today's plan had no further questions or concerns.      Final Clinical Impressions(s) / ED Diagnoses   Final diagnoses:  Acute gastritis, presence of bleeding unspecified, unspecified gastritis type    ED Discharge Orders         Ordered    ranitidine (ZANTAC) 150 MG capsule  Daily     02/24/18 2041    omeprazole (PRILOSEC) 20 MG capsule  Daily     02/24/18 2041           Eyvonne Mechanic, PA-C 02/24/18 2110    Sabas Sous, MD 02/24/18 [phone]

## 2018-02-24 NOTE — ED Provider Notes (Signed)
Patient placed in Quick Look pathway, seen and evaluated   Chief Complaint: Chest pain  HPI:   Substernal, tight chest pain onset 4 AM.  Radiating to epigastrium, periumbilical abdomen.  Intermittent.  Nonexertional, nonpleuritic.  Decreased appetite.  History of acid reflux but has never felt this way before.  No history of ulcers.  ROS: Negative: Fevers, nausea, vomiting, shortness of breath, diarrhea.  Physical Exam:   Gen: No distress  Neuro: Awake and Alert  Skin: Warm    Focused Exam: RRR.  Lungs CTAB.  No abdominal tenderness, negative Murphy's and McBurney's.   Initiation of care has begun. The patient has been counseled on the process, plan, and necessity for staying for the completion/evaluation, and the remainder of the medical screening examination    Jerrell Mylar 02/24/18 1810    Cathren Laine, MD 02/24/18 2245

## 2018-02-24 NOTE — ED Notes (Signed)
Patient verbalizes understanding of discharge instructions. Opportunity for questioning and answers were provided. Armband removed by staff, pt discharged from ED ambulatory.   

## 2018-02-24 NOTE — ED Triage Notes (Signed)
Pt states he started having sharp CP around 3am this morning. Pain is now intermittent and feels like a band around his lower chest. Denies SOB/nausea.

## 2018-06-29 DIAGNOSIS — Z23 Encounter for immunization: Secondary | ICD-10-CM | POA: Diagnosis not present

## 2018-06-29 DIAGNOSIS — Z789 Other specified health status: Secondary | ICD-10-CM | POA: Diagnosis not present

## 2018-06-29 DIAGNOSIS — Z111 Encounter for screening for respiratory tuberculosis: Secondary | ICD-10-CM | POA: Diagnosis not present

## 2018-07-14 DIAGNOSIS — Z111 Encounter for screening for respiratory tuberculosis: Secondary | ICD-10-CM | POA: Diagnosis not present

## 2020-01-18 IMAGING — DX DG CHEST 2V
2 series · 2 of 2 positions shown · non-contrast
Comparison: None.

CLINICAL DATA: Pt c/o central chest pain that radiates to his
abdomen x 1 day. No hx of heart or lung problems. Pt is a current
smoker.

EXAM:
CHEST - 2 VIEW

[w chest pa]
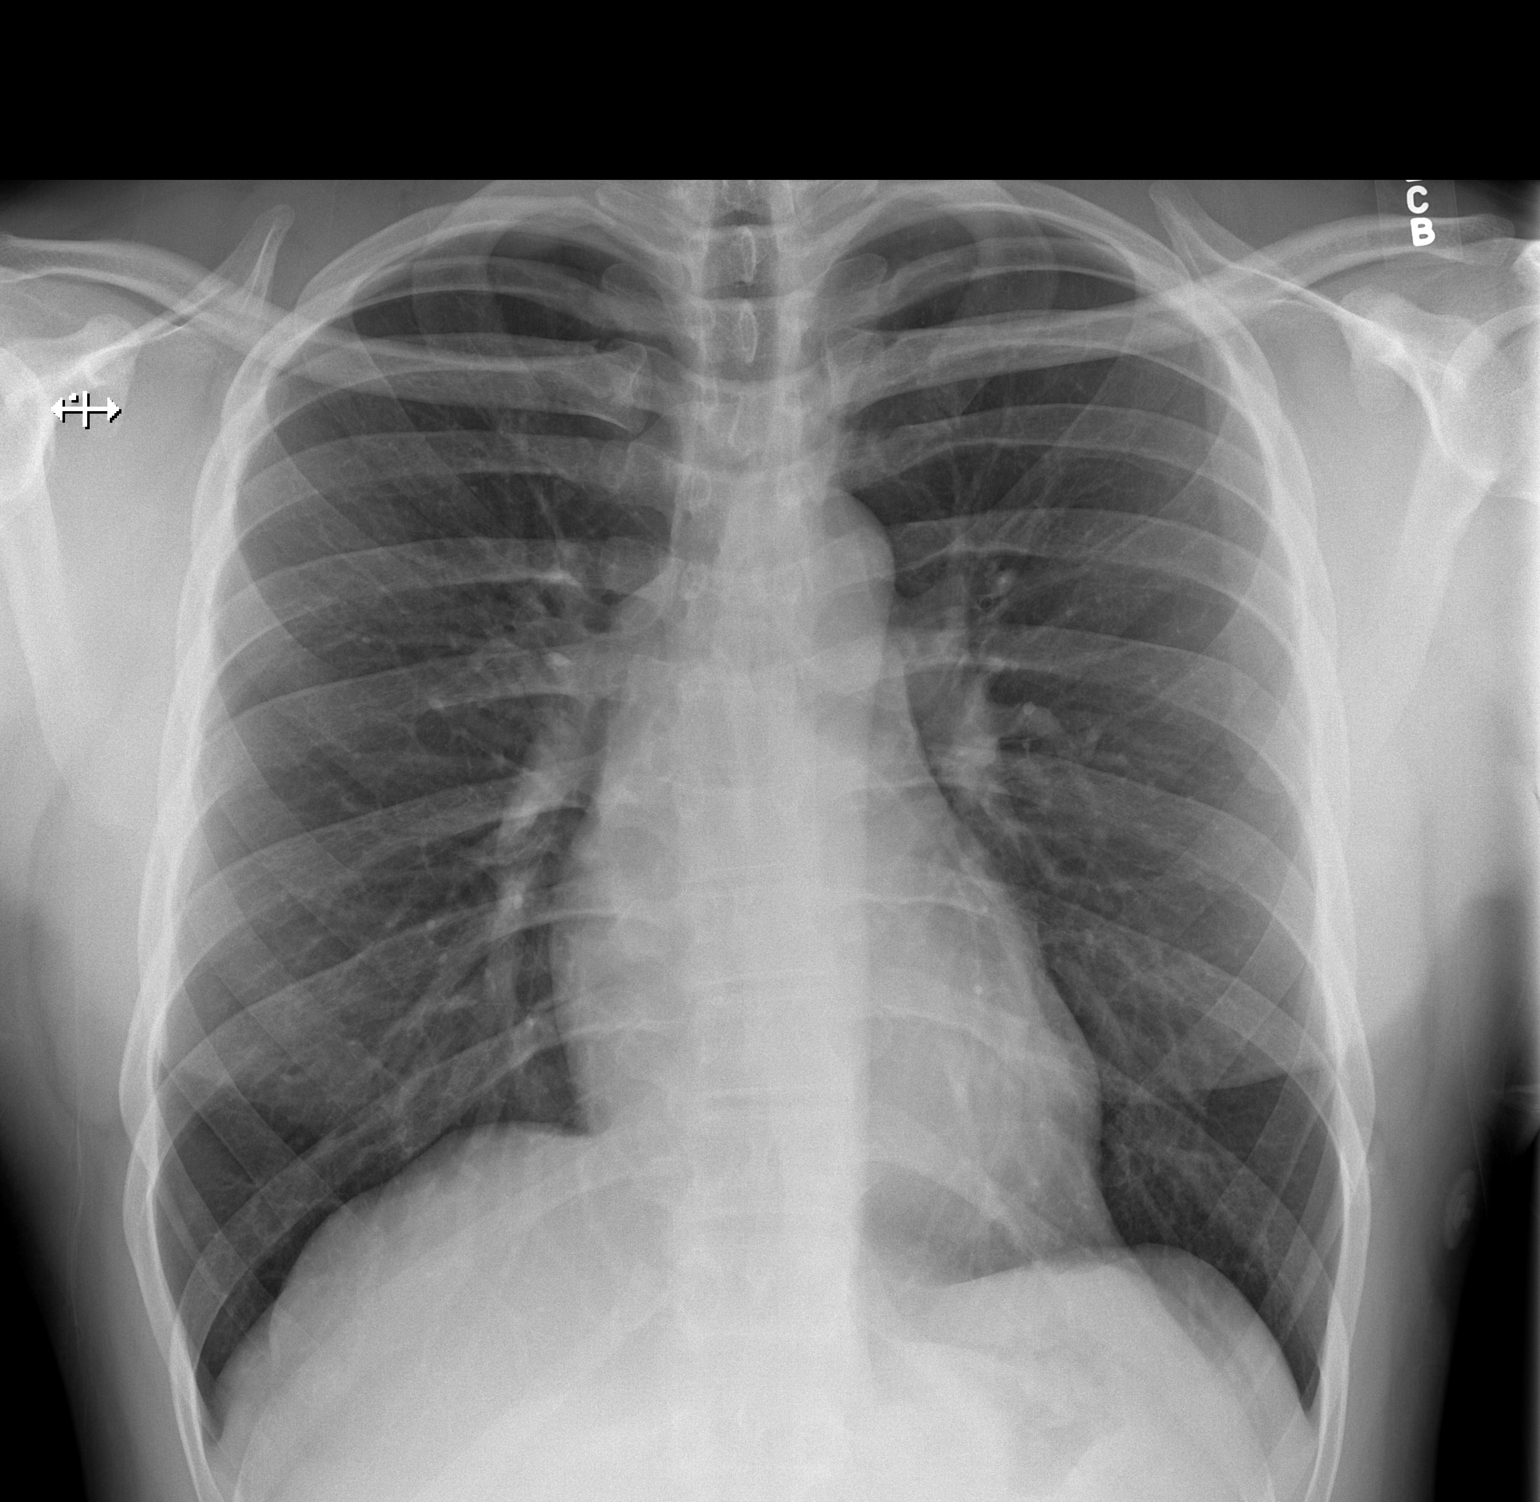

[w chest lat]
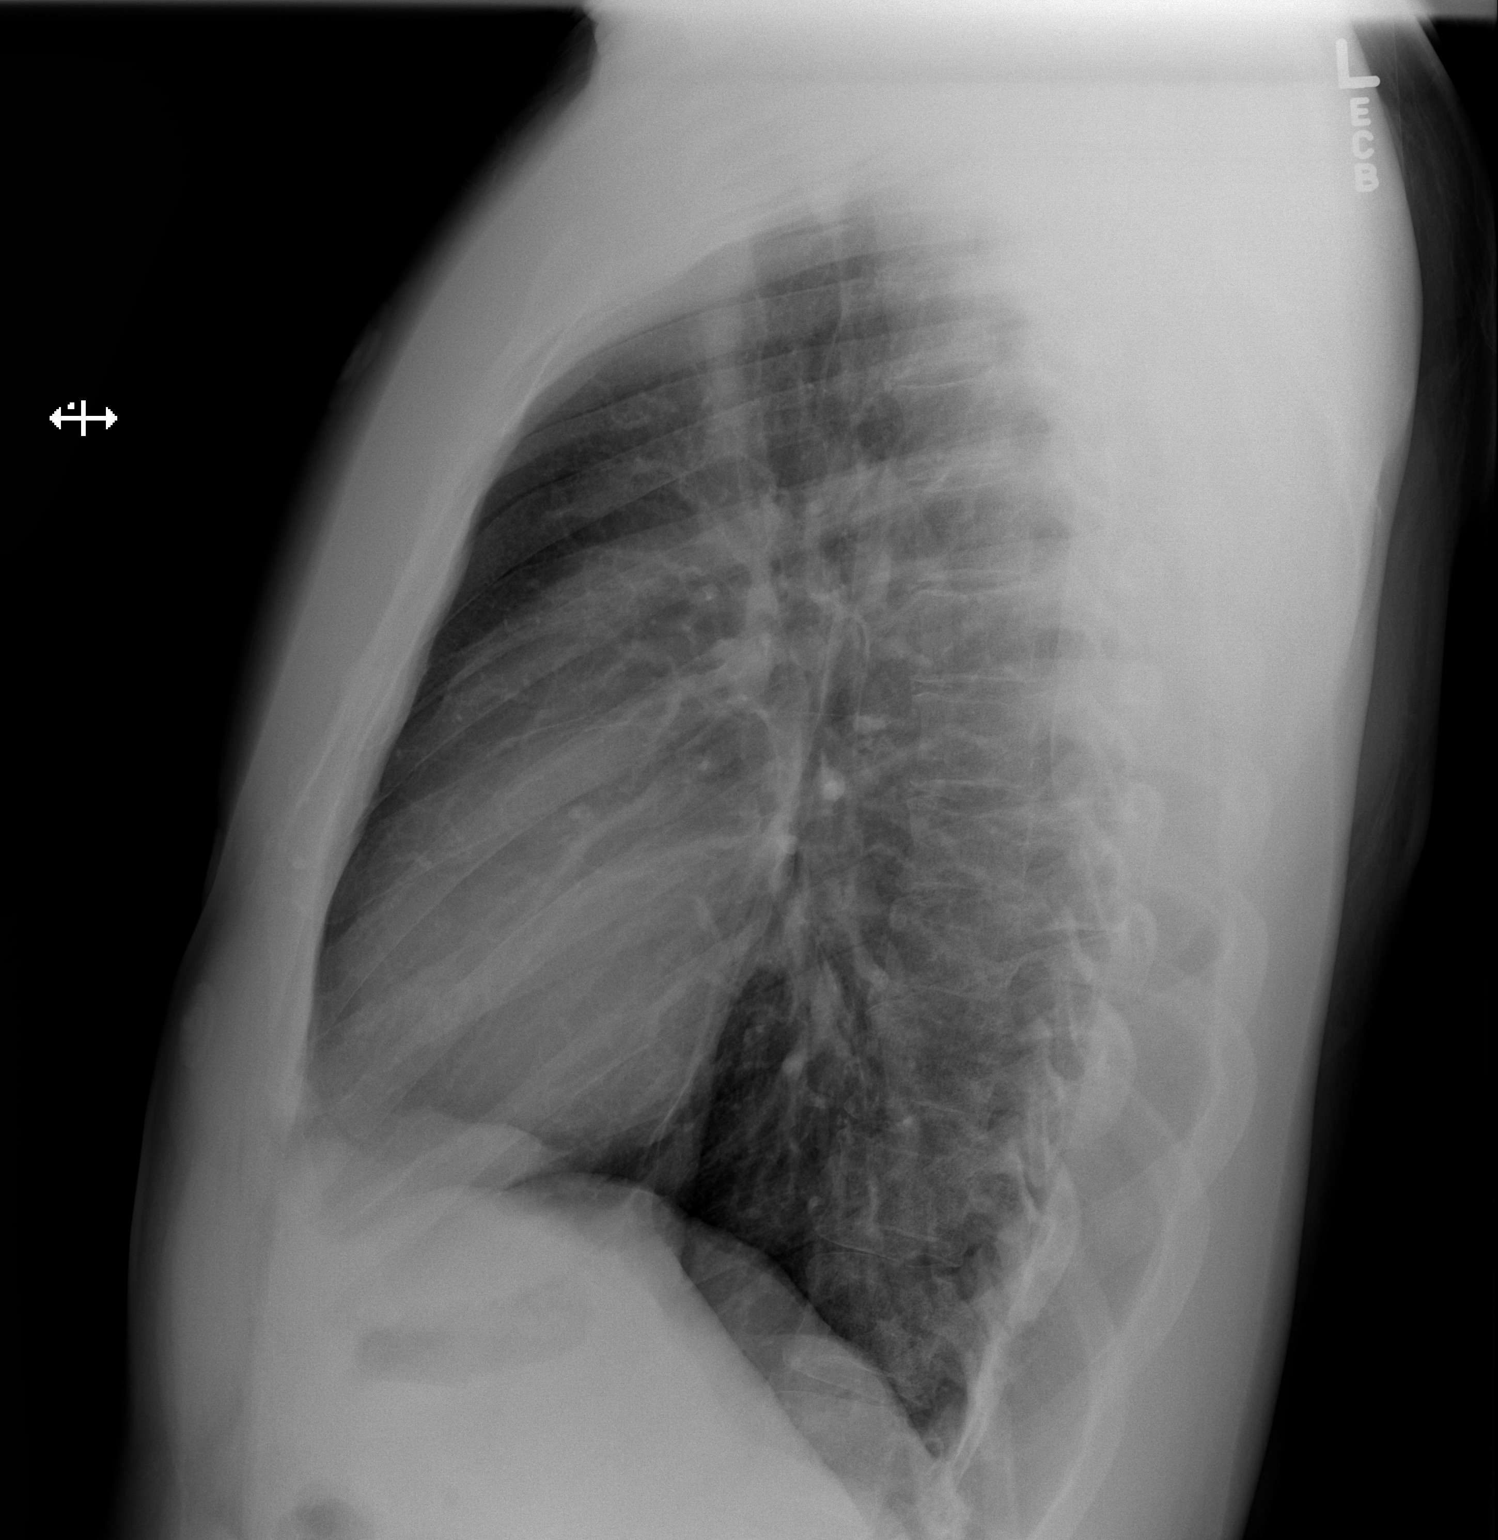

[2 of 2 positions shown; findings below may reference images not displayed]

FINDINGS: The heart size and mediastinal contours are within normal limits.
Both lungs are clear. The visualized skeletal structures are
unremarkable.
IMPRESSION: No active cardiopulmonary disease.
